# Patient Record
Sex: Male | Born: 1970 | Hispanic: No | Marital: Married | Smoking: Never smoker
Health system: Southern US, Community
[De-identification: ages and names within clinical notes are randomized; demographics above are authoritative.]

## PROBLEM LIST (undated history)

## (undated) DIAGNOSIS — I10 Essential (primary) hypertension: Secondary | ICD-10-CM

## (undated) HISTORY — PX: PILONIDAL CYST EXCISION: SHX744

## (undated) HISTORY — PX: MEDIAL COLLATERAL LIGAMENT REPAIR, KNEE: SHX2019

---

## 2020-04-21 ENCOUNTER — Encounter (HOSPITAL_COMMUNITY): Payer: Self-pay

## 2020-04-21 ENCOUNTER — Other Ambulatory Visit: Payer: Self-pay

## 2020-04-21 ENCOUNTER — Emergency Department (HOSPITAL_COMMUNITY)
Admission: EM | Admit: 2020-04-21 | Discharge: 2020-04-22 | Disposition: A | Discharge status: Home / Self Care | Payer: BC Managed Care – PPO | Attending: Emergency Medicine | Admitting: Emergency Medicine

## 2020-04-21 DIAGNOSIS — Y9389 Activity, other specified: Secondary | ICD-10-CM | POA: Insufficient documentation

## 2020-04-21 DIAGNOSIS — Y9289 Other specified places as the place of occurrence of the external cause: Secondary | ICD-10-CM | POA: Diagnosis not present

## 2020-04-21 DIAGNOSIS — W01198A Fall on same level from slipping, tripping and stumbling with subsequent striking against other object, initial encounter: Secondary | ICD-10-CM | POA: Insufficient documentation

## 2020-04-21 DIAGNOSIS — I10 Essential (primary) hypertension: Secondary | ICD-10-CM | POA: Diagnosis not present

## 2020-04-21 DIAGNOSIS — S8012XA Contusion of left lower leg, initial encounter: Secondary | ICD-10-CM | POA: Diagnosis not present

## 2020-04-21 DIAGNOSIS — Y999 Unspecified external cause status: Secondary | ICD-10-CM | POA: Diagnosis not present

## 2020-04-21 DIAGNOSIS — S8992XA Unspecified injury of left lower leg, initial encounter: Secondary | ICD-10-CM | POA: Diagnosis present

## 2020-04-21 DIAGNOSIS — T148XXA Other injury of unspecified body region, initial encounter: Secondary | ICD-10-CM

## 2020-04-21 DIAGNOSIS — S80822A Blister (nonthermal), left lower leg, initial encounter: Secondary | ICD-10-CM | POA: Diagnosis not present

## 2020-04-21 HISTORY — DX: Essential (primary) hypertension: I10

## 2020-04-21 NOTE — ED Triage Notes (Signed)
Arrived POV from home. Patient reports he tripped, injuring hitting his letf shin. Patient states he did not really think anything of it, except for now it is very bruised and swollen.

## 2020-04-21 NOTE — ED Provider Notes (Signed)
[facility] Provider Note   CSN: [id number] Arrival date & time: 04/21/20  2309   Time seen 11:41 PM  History Chief Complaint  Patient presents with  . Leg Injury    Joel Shepherd. is a 49 y.o. male.  HPI   Patient states he is here for a convention.  He states about 2:30 PM he was stepping up onto the stage which was about 1-1/2 feet higher than the floor he was standing on and he fell and hit the stage with his left shin.  He states he felt fine afterwards.  About 5:30 PM he felt some tingling and when he looked there was some minimal bruising present.  He states later on it started feeling tight.  He states he has some pain when he walks now.  He denies any bleeding.  Patient also states he left his blood pressure medication at home and has not had it for the past 2 days.  PCP Patient, No Pcp Per   Past Medical History:  Diagnosis Date  . Hypertension     There are no problems to display for this patient.   Past Surgical History:  Procedure Laterality Date  . MEDIAL COLLATERAL LIGAMENT REPAIR, KNEE Left   . PILONIDAL CYST EXCISION         No family history on file.  Social History   Tobacco Use  . Smoking status: Never Smoker  . Smokeless tobacco: Never Used  Substance Use Topics  . Alcohol use: Yes  . Drug use: Not Currently  lives with spouse  Home Medications Prior to Admission medications   Not on File  Edarbi 40 mg daily  Allergies    Penicillins  Review of Systems   Review of Systems  All other systems reviewed and are negative.   Physical Exam Updated Vital Signs BP (!) 181/103 (BP Location: Left Arm) Comment: pt takes lisinopril in the mornings, but did not today as he was traveling  Pulse 81   Temp 98.1 F (36.7 C) (Oral)   Resp 16   Ht 6' (1.829 m)   Wt 133.8 kg   SpO2 98%   BMI 40.01 kg/m   Vital signs normal except hypertension while not taking his medication   Physical Exam Vitals  and nursing note reviewed.  Constitutional:      Appearance: Normal appearance. He is obese.  HENT:     Head: Normocephalic and atraumatic.     Right Ear: External ear normal.     Left Ear: External ear normal.  Eyes:     Extraocular Movements: Extraocular movements intact.     Conjunctiva/sclera: Conjunctivae normal.  Cardiovascular:     Rate and Rhythm: Normal rate.  Pulmonary:     Effort: Pulmonary effort is normal. No respiratory distress.  Musculoskeletal:        General: Swelling and tenderness present. No deformity. Normal range of motion.     Cervical back: Normal range of motion.     Comments: Patient has some mild swelling of his left lower leg, his left knee is nontender, there is no joint effusion noted his left ankle was nontender, he is noted to have a blood-filled blister on his anterior shin with some bruising surrounding it.  He has some mild tenderness in the area.  Skin:    General: Skin is warm and dry.     Findings: No bruising.  Neurological:     General: No focal deficit present.  Mental Status: He is alert and oriented to person, place, and time.     Cranial Nerves: No cranial nerve deficit.  Psychiatric:        Mood and Affect: Mood normal.        Behavior: Behavior normal.        Thought Content: Thought content normal.       ED Results / Procedures / Treatments   Labs (all labs ordered are listed, but only abnormal results are displayed) Labs Reviewed - No data to display  EKG None  Radiology DG Tibia/Fibula Left  Result Date: 04/22/2020 CLINICAL DATA:  Injury, shin pain EXAM: LEFT TIBIA AND FIBULA - 2 VIEW COMPARISON:  None. FINDINGS: There is no evidence of fracture or other focal bone lesions. Soft tissue swelling is seen over the pretibial region. IMPRESSION: No acute osseous abnormality.  Overlying soft tissue swelling. Electronically Signed   By: Prudencio Pair M.D.   On: 04/22/2020 00:30    Procedures Procedures (including critical  care time)  Medications Ordered in ED Medications - No data to display  ED Course  I have reviewed the triage vital signs and the nursing notes.  Pertinent labs & imaging results that were available during my care of the patient were reviewed by me and considered in my medical decision making (see chart for details).    MDM Rules/Calculators/A&P                          Patient states he does not need crutches.  However he does want x-rays.  I discussed with him that x-rays only show the bones not soft tissue but he wants an x-ray anyway "to make sure there is no damage inside".  He has an ice pack on his leg.  Patient is aware his blood pressure is elevated however he left his blood pressure medication at home when he came for the convention.  Patient's x-ray only shows a soft tissue swelling which we can see visually.  He was discharged back to his hotel.  He states they are going home tomorrow and his wife is with him so she can drive.  Final Clinical Impression(s) / ED Diagnoses Final diagnoses:  Contusion of left lower leg, initial encounter  Blood blister    Rx / DC Orders ED Discharge Orders    None    OTC ibuprofen and acetaminophen  Plan discharge  Rolland Porter, MD, Barbette Or, MD 04/22/20 [phone]

## 2020-04-22 ENCOUNTER — Emergency Department (HOSPITAL_COMMUNITY): Payer: BC Managed Care – PPO

## 2020-04-22 NOTE — Discharge Instructions (Addendum)
Your xray was normal except for some soft tissue swelling which you can see. Elevate your leg. Use ice packs for comfort and swelling. You can use heat after a week. You can take ibuprofen 600 mg and/or acetaminophen for pain if needed. Recheck if you get a fever. When the blister ruptures you will see some bleeding. TAKE YOUR BLOOD PRESSURE MEDICATION WHEN YOU GET HOME.

## 2021-01-29 IMAGING — CR DG TIBIA/FIBULA 2V*L*
4 series · 4 of 4 positions shown · non-contrast
Comparison: None.

CLINICAL DATA: Injury, shin pain

EXAM:
LEFT TIBIA AND FIBULA - 2 VIEW

[t tib-fib ap left (1 of 2)]
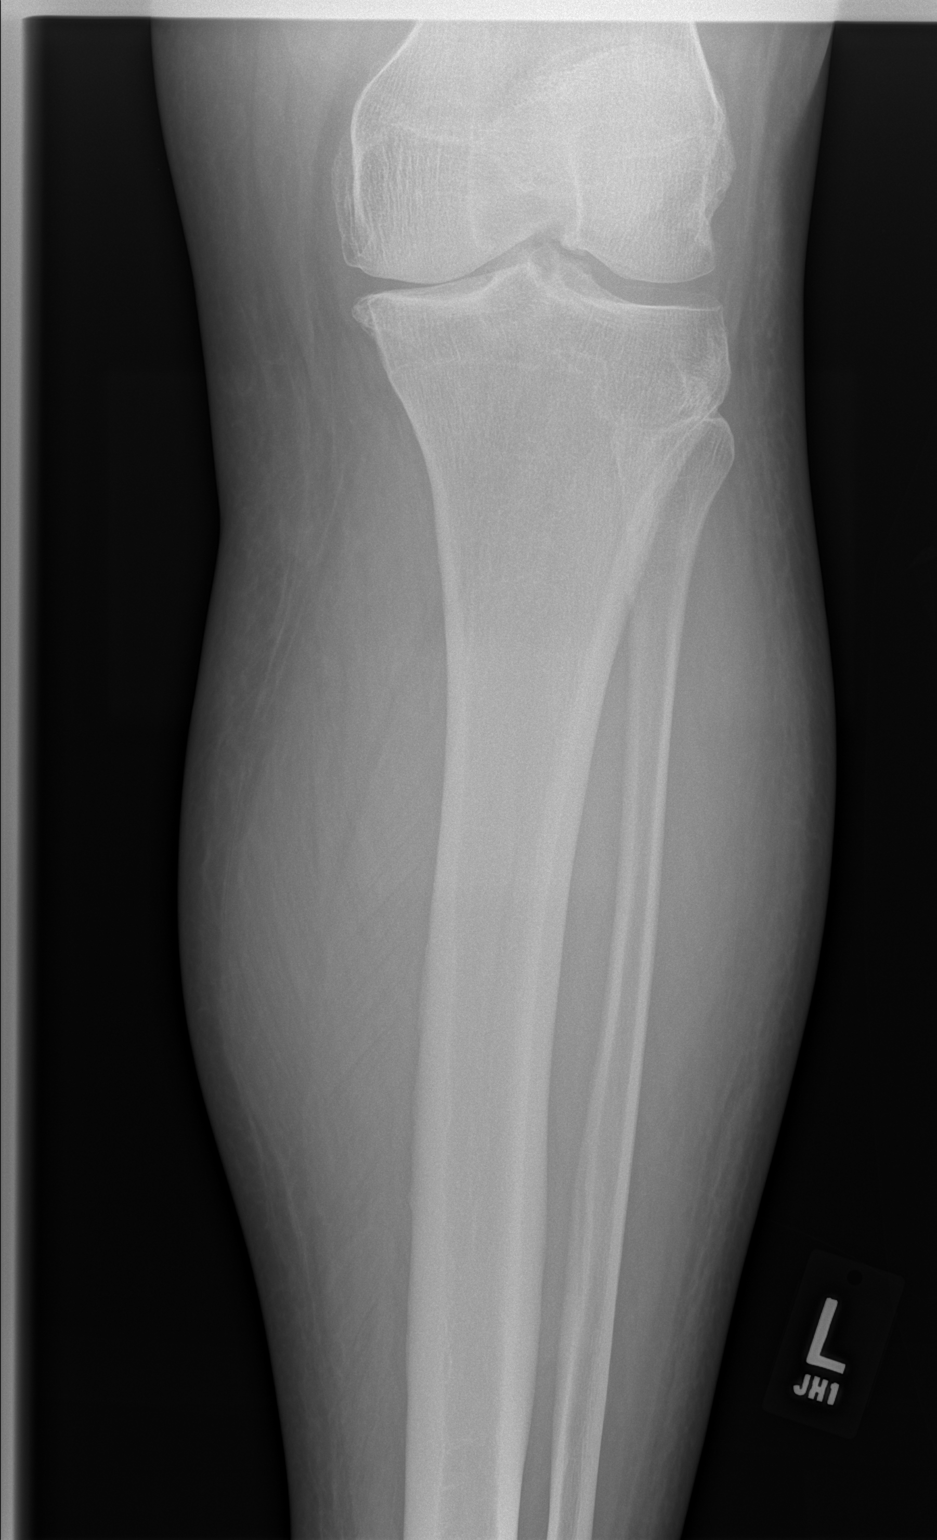

[t tib-fib ap left (2 of 2)]
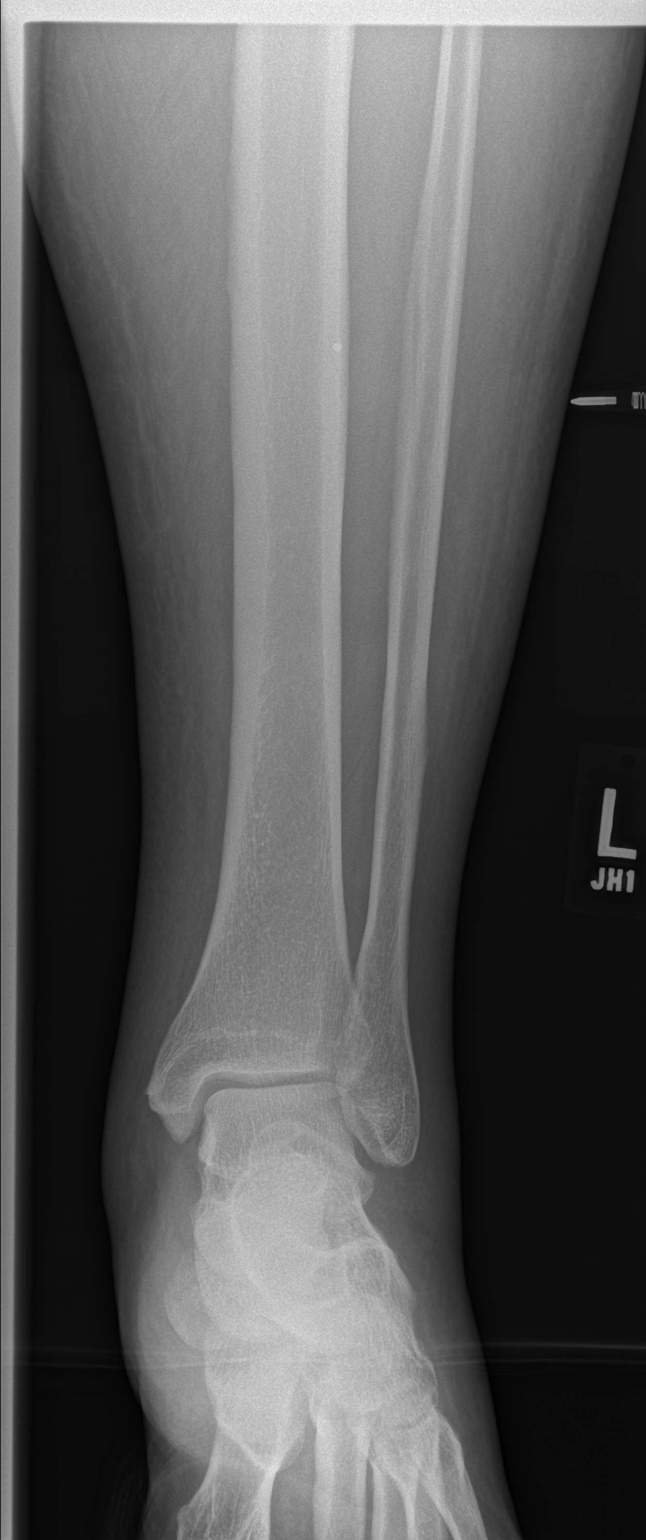

[t tib-fib lat left (1 of 2)]
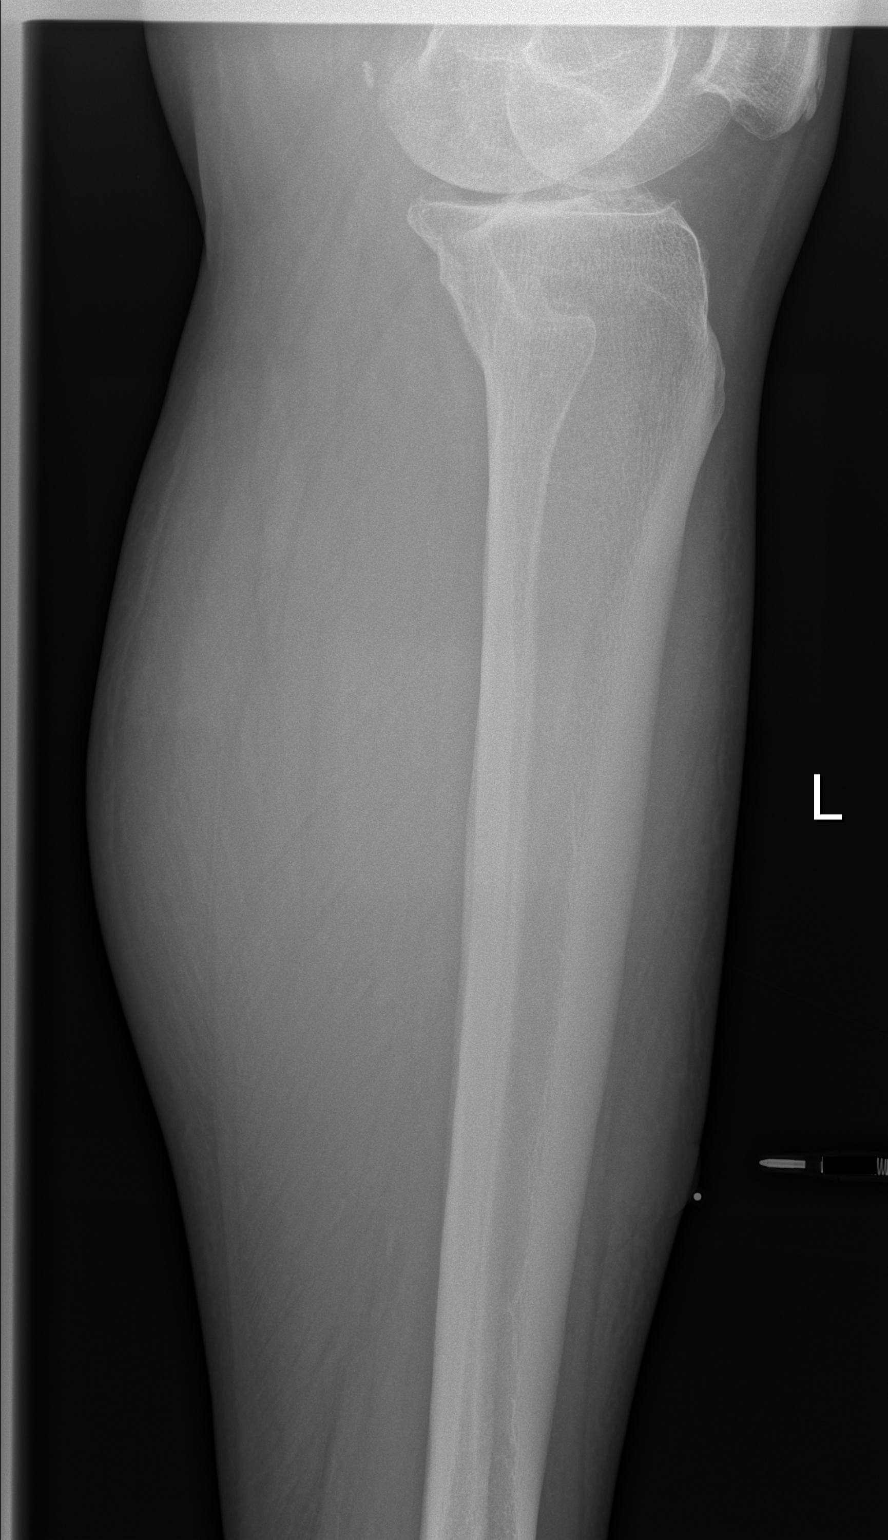

[t tib-fib lat left (2 of 2)]
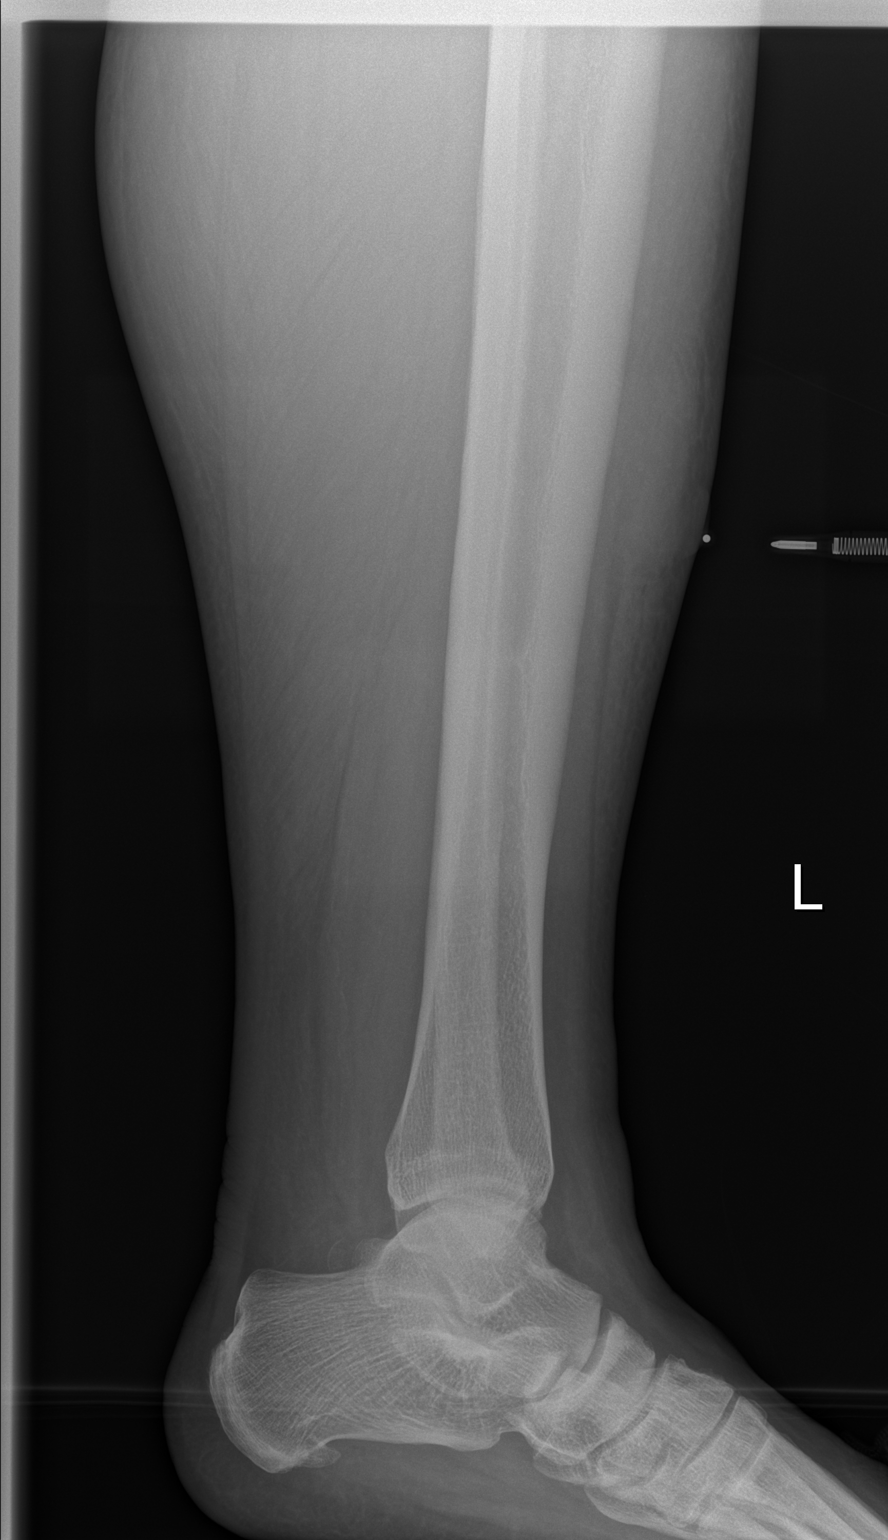

[4 of 4 positions shown; findings below may reference images not displayed]

FINDINGS: There is no evidence of fracture or other focal bone lesions. Soft
tissue swelling is seen over the pretibial region.
IMPRESSION: No acute osseous abnormality.  Overlying soft tissue swelling.
